# Patient Record
Sex: Female | Born: 1965 | Race: Black or African American | Hispanic: No | Marital: Married | Smoking: Never smoker
Health system: Southern US, Community
[De-identification: ages and names within clinical notes are randomized; demographics above are authoritative.]

## PROBLEM LIST (undated history)

## (undated) DIAGNOSIS — I10 Essential (primary) hypertension: Secondary | ICD-10-CM

## (undated) DIAGNOSIS — E785 Hyperlipidemia, unspecified: Secondary | ICD-10-CM

## (undated) HISTORY — PX: TONSILLECTOMY: SUR1361

---

## 1998-06-15 ENCOUNTER — Ambulatory Visit (HOSPITAL_COMMUNITY): Admission: RE | Admit: 1998-06-15 | Discharge: 1998-06-15 | Payer: Self-pay | Admitting: Obstetrics

## 1998-10-14 ENCOUNTER — Ambulatory Visit (HOSPITAL_COMMUNITY): Admission: RE | Admit: 1998-10-14 | Discharge: 1998-10-14 | Payer: Self-pay | Admitting: *Deleted

## 1999-01-13 ENCOUNTER — Inpatient Hospital Stay (HOSPITAL_COMMUNITY): Admission: AD | Admit: 1999-01-13 | Discharge: 1999-01-15 | Payer: Self-pay | Admitting: Obstetrics

## 1999-01-17 ENCOUNTER — Encounter (HOSPITAL_COMMUNITY): Admission: RE | Admit: 1999-01-17 | Discharge: 1999-04-17 | Payer: Self-pay | Admitting: Obstetrics

## 2003-09-21 ENCOUNTER — Ambulatory Visit (HOSPITAL_COMMUNITY): Admission: RE | Admit: 2003-09-21 | Discharge: 2003-09-21 | Payer: Self-pay | Admitting: Family Medicine

## 2004-01-11 ENCOUNTER — Emergency Department (HOSPITAL_COMMUNITY): Admission: EM | Admit: 2004-01-11 | Discharge: 2004-01-11 | Payer: Self-pay | Admitting: Family Medicine

## 2006-06-25 ENCOUNTER — Encounter: Admission: RE | Admit: 2006-06-25 | Discharge: 2006-06-25 | Payer: Self-pay | Admitting: Obstetrics

## 2008-04-01 ENCOUNTER — Emergency Department (HOSPITAL_COMMUNITY): Admission: EM | Admit: 2008-04-01 | Discharge: 2008-04-01 | Payer: Self-pay | Admitting: Emergency Medicine

## 2009-02-05 ENCOUNTER — Encounter: Admission: RE | Admit: 2009-02-05 | Discharge: 2009-02-05 | Payer: Self-pay | Admitting: Family Medicine

## 2010-05-28 ENCOUNTER — Emergency Department (HOSPITAL_COMMUNITY): Admission: EM | Admit: 2010-05-28 | Discharge: 2010-05-28 | Payer: Self-pay | Admitting: Family Medicine

## 2010-07-13 ENCOUNTER — Encounter: Admission: RE | Admit: 2010-07-13 | Discharge: 2010-07-13 | Payer: Self-pay | Admitting: Obstetrics

## 2011-05-23 ENCOUNTER — Other Ambulatory Visit: Payer: Self-pay | Admitting: Obstetrics

## 2011-05-26 LAB — DIFFERENTIAL
Basophils Absolute: 0
Basophils Relative: 1
Eosinophils Absolute: 0.1
Eosinophils Relative: 2
Lymphocytes Relative: 12
Lymphs Abs: 0.9
Monocytes Absolute: 0.7
Monocytes Relative: 10
Neutro Abs: 5.4
Neutrophils Relative %: 76

## 2011-05-26 LAB — POCT I-STAT, CHEM 8
BUN: 11
BUN: 9
Calcium, Ion: 1.01 — ABNORMAL LOW
Calcium, Ion: 1.16
Chloride: 107
Chloride: 107
Creatinine, Ser: 1.1
Creatinine, Ser: 1.1
Glucose, Bld: 85
Glucose, Bld: 90
HCT: 46
HCT: 46
Hemoglobin: 15.6 — ABNORMAL HIGH
Hemoglobin: 15.6 — ABNORMAL HIGH
Potassium: 3.5
Potassium: 4.2
Sodium: 138
Sodium: 141
TCO2: 23
TCO2: 25

## 2011-05-26 LAB — POCT CARDIAC MARKERS
CKMB, poc: 1.6
CKMB, poc: 2.7
Myoglobin, poc: 203
Myoglobin, poc: 277
Troponin i, poc: <0.05
Troponin i, poc: <0.05

## 2011-05-26 LAB — CBC
HCT: 44.8
Hemoglobin: 15.1 — ABNORMAL HIGH
MCHC: 33.7
MCV: 91.7
Platelets: 246
RBC: 4.89
RDW: 13.2
WBC: 7.1

## 2011-05-26 LAB — PREGNANCY, URINE: Preg Test, Ur: NEGATIVE

## 2011-09-18 ENCOUNTER — Other Ambulatory Visit: Payer: Self-pay | Admitting: Obstetrics

## 2011-09-18 DIAGNOSIS — Z1231 Encounter for screening mammogram for malignant neoplasm of breast: Secondary | ICD-10-CM

## 2011-10-16 ENCOUNTER — Ambulatory Visit
Admission: RE | Admit: 2011-10-16 | Discharge: 2011-10-16 | Disposition: A | Discharge status: Home / Self Care | Payer: 59 | Source: Ambulatory Visit | Attending: Obstetrics | Admitting: Obstetrics

## 2011-10-16 DIAGNOSIS — Z1231 Encounter for screening mammogram for malignant neoplasm of breast: Secondary | ICD-10-CM

## 2013-06-24 ENCOUNTER — Other Ambulatory Visit: Payer: Self-pay

## 2013-06-24 DIAGNOSIS — Z1231 Encounter for screening mammogram for malignant neoplasm of breast: Secondary | ICD-10-CM

## 2013-07-16 ENCOUNTER — Ambulatory Visit
Admission: RE | Admit: 2013-07-16 | Discharge: 2013-07-16 | Disposition: A | Discharge status: Home / Self Care | Payer: BC Managed Care – PPO | Source: Ambulatory Visit

## 2013-07-16 DIAGNOSIS — Z1231 Encounter for screening mammogram for malignant neoplasm of breast: Secondary | ICD-10-CM

## 2014-09-03 ENCOUNTER — Other Ambulatory Visit: Payer: Self-pay

## 2014-09-03 DIAGNOSIS — Z1231 Encounter for screening mammogram for malignant neoplasm of breast: Secondary | ICD-10-CM

## 2014-09-10 ENCOUNTER — Ambulatory Visit
Admission: RE | Admit: 2014-09-10 | Discharge: 2014-09-10 | Disposition: A | Discharge status: Home / Self Care | Payer: BLUE CROSS/BLUE SHIELD | Source: Ambulatory Visit

## 2014-09-10 DIAGNOSIS — Z1231 Encounter for screening mammogram for malignant neoplasm of breast: Secondary | ICD-10-CM

## 2015-10-25 ENCOUNTER — Other Ambulatory Visit: Payer: Self-pay

## 2015-10-25 DIAGNOSIS — Z1231 Encounter for screening mammogram for malignant neoplasm of breast: Secondary | ICD-10-CM

## 2015-11-04 ENCOUNTER — Ambulatory Visit
Admission: RE | Admit: 2015-11-04 | Discharge: 2015-11-04 | Disposition: A | Discharge status: Home / Self Care | Payer: BLUE CROSS/BLUE SHIELD | Source: Ambulatory Visit

## 2015-11-04 DIAGNOSIS — Z1231 Encounter for screening mammogram for malignant neoplasm of breast: Secondary | ICD-10-CM

## 2016-01-13 DIAGNOSIS — J309 Allergic rhinitis, unspecified: Secondary | ICD-10-CM | POA: Diagnosis not present

## 2016-01-13 DIAGNOSIS — E782 Mixed hyperlipidemia: Secondary | ICD-10-CM | POA: Diagnosis not present

## 2016-01-13 DIAGNOSIS — I10 Essential (primary) hypertension: Secondary | ICD-10-CM | POA: Diagnosis not present

## 2016-04-22 ENCOUNTER — Encounter: Payer: Self-pay | Admitting: *Deleted

## 2016-07-11 DIAGNOSIS — Z0000 Encounter for general adult medical examination without abnormal findings: Secondary | ICD-10-CM | POA: Diagnosis not present

## 2016-07-11 DIAGNOSIS — Z23 Encounter for immunization: Secondary | ICD-10-CM | POA: Diagnosis not present

## 2016-07-11 DIAGNOSIS — I10 Essential (primary) hypertension: Secondary | ICD-10-CM | POA: Diagnosis not present

## 2016-07-11 DIAGNOSIS — J309 Allergic rhinitis, unspecified: Secondary | ICD-10-CM | POA: Diagnosis not present

## 2016-07-11 DIAGNOSIS — E782 Mixed hyperlipidemia: Secondary | ICD-10-CM | POA: Diagnosis not present

## 2016-11-27 ENCOUNTER — Other Ambulatory Visit: Payer: Self-pay | Admitting: Family Medicine

## 2016-11-27 DIAGNOSIS — Z1231 Encounter for screening mammogram for malignant neoplasm of breast: Secondary | ICD-10-CM

## 2016-12-13 ENCOUNTER — Ambulatory Visit
Admission: RE | Admit: 2016-12-13 | Discharge: 2016-12-13 | Disposition: A | Discharge status: Home / Self Care | Payer: BLUE CROSS/BLUE SHIELD | Source: Ambulatory Visit | Attending: Family Medicine | Admitting: Family Medicine

## 2016-12-13 DIAGNOSIS — Z1231 Encounter for screening mammogram for malignant neoplasm of breast: Secondary | ICD-10-CM

## 2017-01-10 DIAGNOSIS — Z01419 Encounter for gynecological examination (general) (routine) without abnormal findings: Secondary | ICD-10-CM | POA: Diagnosis not present

## 2017-01-16 DIAGNOSIS — I10 Essential (primary) hypertension: Secondary | ICD-10-CM | POA: Diagnosis not present

## 2017-01-16 DIAGNOSIS — J309 Allergic rhinitis, unspecified: Secondary | ICD-10-CM | POA: Diagnosis not present

## 2017-01-16 DIAGNOSIS — E782 Mixed hyperlipidemia: Secondary | ICD-10-CM | POA: Diagnosis not present

## 2017-01-16 DIAGNOSIS — M722 Plantar fascial fibromatosis: Secondary | ICD-10-CM | POA: Diagnosis not present

## 2017-02-26 DIAGNOSIS — M545 Low back pain: Secondary | ICD-10-CM | POA: Diagnosis not present

## 2017-02-26 DIAGNOSIS — M722 Plantar fascial fibromatosis: Secondary | ICD-10-CM | POA: Diagnosis not present

## 2017-05-02 DIAGNOSIS — H524 Presbyopia: Secondary | ICD-10-CM | POA: Diagnosis not present

## 2017-05-02 DIAGNOSIS — H40051 Ocular hypertension, right eye: Secondary | ICD-10-CM | POA: Diagnosis not present

## 2017-05-02 DIAGNOSIS — H5213 Myopia, bilateral: Secondary | ICD-10-CM | POA: Diagnosis not present

## 2017-07-24 DIAGNOSIS — E782 Mixed hyperlipidemia: Secondary | ICD-10-CM | POA: Diagnosis not present

## 2017-08-08 ENCOUNTER — Encounter: Payer: Self-pay | Admitting: Podiatry

## 2017-08-08 ENCOUNTER — Ambulatory Visit (INDEPENDENT_AMBULATORY_CARE_PROVIDER_SITE_OTHER): Payer: BLUE CROSS/BLUE SHIELD

## 2017-08-08 ENCOUNTER — Ambulatory Visit: Payer: BLUE CROSS/BLUE SHIELD | Admitting: Podiatry

## 2017-08-08 VITALS — Ht 63.0 in | Wt 248.0 lb

## 2017-08-08 DIAGNOSIS — M722 Plantar fascial fibromatosis: Secondary | ICD-10-CM

## 2017-08-08 MED ORDER — MELOXICAM 15 MG PO TABS: 15.0000 mg | ORAL_TABLET | Freq: Every day | ORAL | 0 refills | Status: AC

## 2017-08-08 NOTE — Progress Notes (Signed)
   Subjective:    Patient ID: Rachel Grieveamela M Mcconnell, female    DOB: 09/04/1965, 51 y.o.   MRN: [id number]  HPI  Chief Complaint  Patient presents with  . Foot Pain    bilateral foot pain - Dx'd 2 years ago with plantar fasciitis - acute pain since June - OTC inserts and compression socks have not helped       Review of Systems  All other systems reviewed and are negative.      Objective:   Physical Exam        Assessment & Plan:

## 2017-08-08 NOTE — Patient Instructions (Signed)

## 2017-08-12 NOTE — Progress Notes (Signed)
   Subjective: 51 year old female presents today as a new patient for pain and tenderness in the heels bilaterally that has been ongoing for the past 2 years.  She states the pain has worsened in the last 6 months.  Patient states that it hurts in the morning with the first steps out of bed.  She states she has tried wearing compression socks and OTC inserts but nothing has worked to alleviate the pain.  Patient presents today for further treatment and evaluation.   History reviewed. No pertinent past medical history.   Objective: Physical Exam General: The patient is alert and oriented x3 in no acute distress.  Dermatology: Skin is warm, dry and supple bilateral lower extremities. Negative for open lesions or macerations bilateral.   Vascular: Dorsalis Pedis and Posterior Tibial pulses palpable bilateral.  Capillary fill time is immediate to all digits.  Neurological: Epicritic and protective threshold intact bilateral.   Musculoskeletal: Tenderness to palpation at the medial calcaneal tubercale and through the insertion of the plantar fascia of the bilateral feet. All other joints range of motion within normal limits bilateral. Strength 5/5 in all groups bilateral.   Radiographic exam: Normal osseous mineralization. Joint spaces preserved. No fracture/dislocation/boney destruction. Calcaneal spur present with mild thickening of plantar fascia bilateral. No other soft tissue abnormalities or radiopaque foreign bodies.   Assessment: 1. plantar fasciitis bilateral feet  Plan of Care:  1. Patient evaluated. Xrays reviewed.   2. Injection of 0.5cc Celestone soluspan injected into the bilateral heels.  3. Prescription for meloxicam provided to patient.   4. Patient molded for custom orthotics today.  Dawn will check insurance coverage. 5. Plantar fascial band(s) dispensed for bilateral plantar fasciitis. 6. Instructed patient regarding therapies and modalities at home to alleviate  symptoms.  7. Return to clinic in 4 weeks.    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  Felecia ShellingBrent M. Inna Tisdell, DPM [facility]  Dr. Felecia ShellingBrent M. Thayne Cindric, DPM    [address]                                   [place], [place] [address]                Office [phone]  Fax [phone]

## 2017-09-11 ENCOUNTER — Telehealth: Payer: Self-pay | Admitting: *Deleted

## 2017-09-11 MED ORDER — MELOXICAM 15 MG PO TABS: 15.0000 mg | ORAL_TABLET | Freq: Every day | ORAL | 0 refills | Status: AC

## 2017-09-11 NOTE — Telephone Encounter (Signed)
Refill request meloxicam. Dr. Logan BoresEvans states refill once and pt needs to be seen if continuing to have pain.

## 2017-09-19 ENCOUNTER — Other Ambulatory Visit: Payer: BLUE CROSS/BLUE SHIELD | Admitting: Orthotics

## 2017-09-19 ENCOUNTER — Ambulatory Visit: Payer: BLUE CROSS/BLUE SHIELD | Admitting: Podiatry

## 2017-09-25 DIAGNOSIS — M79676 Pain in unspecified toe(s): Secondary | ICD-10-CM

## 2017-10-03 ENCOUNTER — Ambulatory Visit: Payer: BLUE CROSS/BLUE SHIELD | Admitting: Podiatry

## 2017-10-03 ENCOUNTER — Encounter: Payer: Self-pay | Admitting: Podiatry

## 2017-10-03 DIAGNOSIS — M722 Plantar fascial fibromatosis: Secondary | ICD-10-CM | POA: Diagnosis not present

## 2017-10-07 NOTE — Progress Notes (Signed)
   Subjective: 52 year old female presenting today for follow up evaluation of bilateral plantar fasciitis. She states she has improved significantly. She states she did not like the injections at first, but now she believes they helped resolve the pain. She has been taking Mobic, which helped alleviate the symptoms, but has now discontinued it. Wearing the fascial braces and inserts have also contributed to her relief. Patient is here for further evaluation and treatment.   History reviewed. No pertinent past medical history.    Objective: Physical Exam General: The patient is alert and oriented x3 in no acute distress.  Dermatology: Skin is cool, dry and supple bilateral lower extremities. Negative for open lesions or macerations.  Vascular: Palpable pedal pulses bilaterally. No edema or erythema noted. Capillary refill within normal limits.  Neurological: Epicritic and protective threshold grossly intact bilaterally.   Musculoskeletal Exam: Clinical evidence of bunion deformity noted to the respective foot. There is a moderate pain on palpation range of motion of the first MPJ. Lateral deviation of the hallux noted consistent with hallux abductovalgus.   Assessment: 1. HAV w/ bunion deformity bilateral feet 2. Plantar fasciitis bilateral - resolved   Plan of Care:  1. Patient was evaluated.  2. Today we discussed possible bunion surgery in the future.  3. Continue taking Mobic and wearing custom molded orthotics. 4. Continue stretching.  5. Return to clinic as needed.    Felecia ShellingBrent M. Evans, DPM [facility]  Dr. Felecia ShellingBrent M. Evans, DPM    [address]                                        [place], [place] [address]                Office [phone]  Fax [phone]

## 2017-10-15 ENCOUNTER — Telehealth: Payer: Self-pay | Admitting: *Deleted

## 2017-10-15 NOTE — Telephone Encounter (Signed)
Request for Meloxicam refill. Dr. Logan BoresEvans states pt needs an appt. Return fax denying.

## 2017-10-17 ENCOUNTER — Other Ambulatory Visit: Payer: BLUE CROSS/BLUE SHIELD | Admitting: Orthotics

## 2019-05-14 ENCOUNTER — Other Ambulatory Visit: Payer: Self-pay | Admitting: Family Medicine

## 2019-05-14 DIAGNOSIS — Z1231 Encounter for screening mammogram for malignant neoplasm of breast: Secondary | ICD-10-CM

## 2019-06-27 ENCOUNTER — Ambulatory Visit
Admission: RE | Admit: 2019-06-27 | Discharge: 2019-06-27 | Disposition: A | Discharge status: Home / Self Care | Payer: 59 | Source: Ambulatory Visit | Attending: Family Medicine | Admitting: Family Medicine

## 2019-06-27 ENCOUNTER — Other Ambulatory Visit: Payer: Self-pay

## 2019-06-27 DIAGNOSIS — Z1231 Encounter for screening mammogram for malignant neoplasm of breast: Secondary | ICD-10-CM

## 2019-10-16 DIAGNOSIS — Z0000 Encounter for general adult medical examination without abnormal findings: Secondary | ICD-10-CM | POA: Diagnosis not present

## 2019-10-23 DIAGNOSIS — E782 Mixed hyperlipidemia: Secondary | ICD-10-CM | POA: Diagnosis not present

## 2019-10-23 DIAGNOSIS — Z23 Encounter for immunization: Secondary | ICD-10-CM | POA: Diagnosis not present

## 2019-12-03 DIAGNOSIS — Z1211 Encounter for screening for malignant neoplasm of colon: Secondary | ICD-10-CM | POA: Diagnosis not present

## 2019-12-17 DIAGNOSIS — Z1159 Encounter for screening for other viral diseases: Secondary | ICD-10-CM | POA: Diagnosis not present

## 2019-12-22 DIAGNOSIS — Z1211 Encounter for screening for malignant neoplasm of colon: Secondary | ICD-10-CM | POA: Diagnosis not present

## 2020-04-20 DIAGNOSIS — E782 Mixed hyperlipidemia: Secondary | ICD-10-CM | POA: Diagnosis not present

## 2020-04-20 DIAGNOSIS — I10 Essential (primary) hypertension: Secondary | ICD-10-CM | POA: Diagnosis not present

## 2020-04-20 DIAGNOSIS — J309 Allergic rhinitis, unspecified: Secondary | ICD-10-CM | POA: Diagnosis not present

## 2020-05-11 DIAGNOSIS — Z01419 Encounter for gynecological examination (general) (routine) without abnormal findings: Secondary | ICD-10-CM | POA: Diagnosis not present

## 2020-11-04 DIAGNOSIS — J309 Allergic rhinitis, unspecified: Secondary | ICD-10-CM | POA: Diagnosis not present

## 2020-11-04 DIAGNOSIS — Z0000 Encounter for general adult medical examination without abnormal findings: Secondary | ICD-10-CM | POA: Diagnosis not present

## 2020-11-04 DIAGNOSIS — I10 Essential (primary) hypertension: Secondary | ICD-10-CM | POA: Diagnosis not present

## 2020-11-04 DIAGNOSIS — E782 Mixed hyperlipidemia: Secondary | ICD-10-CM | POA: Diagnosis not present

## 2021-03-21 DIAGNOSIS — H5213 Myopia, bilateral: Secondary | ICD-10-CM | POA: Diagnosis not present

## 2021-03-21 DIAGNOSIS — H524 Presbyopia: Secondary | ICD-10-CM | POA: Diagnosis not present

## 2021-05-12 DIAGNOSIS — E782 Mixed hyperlipidemia: Secondary | ICD-10-CM | POA: Diagnosis not present

## 2021-05-12 DIAGNOSIS — I10 Essential (primary) hypertension: Secondary | ICD-10-CM | POA: Diagnosis not present

## 2021-05-12 DIAGNOSIS — J309 Allergic rhinitis, unspecified: Secondary | ICD-10-CM | POA: Diagnosis not present

## 2021-05-12 DIAGNOSIS — Z23 Encounter for immunization: Secondary | ICD-10-CM | POA: Diagnosis not present

## 2021-05-13 DIAGNOSIS — Z01419 Encounter for gynecological examination (general) (routine) without abnormal findings: Secondary | ICD-10-CM | POA: Diagnosis not present

## 2021-05-20 IMAGING — MG DIGITAL SCREENING BILAT W/ TOMO
6 of 10 series · 6 of 30 positions shown · non-contrast
Comparison: Previous exam(s).

CLINICAL DATA: Screening.

EXAM:
DIGITAL SCREENING BILATERAL MAMMOGRAM WITH TOMO AND CAD

[L MLO synth-2D (1 of 2)]
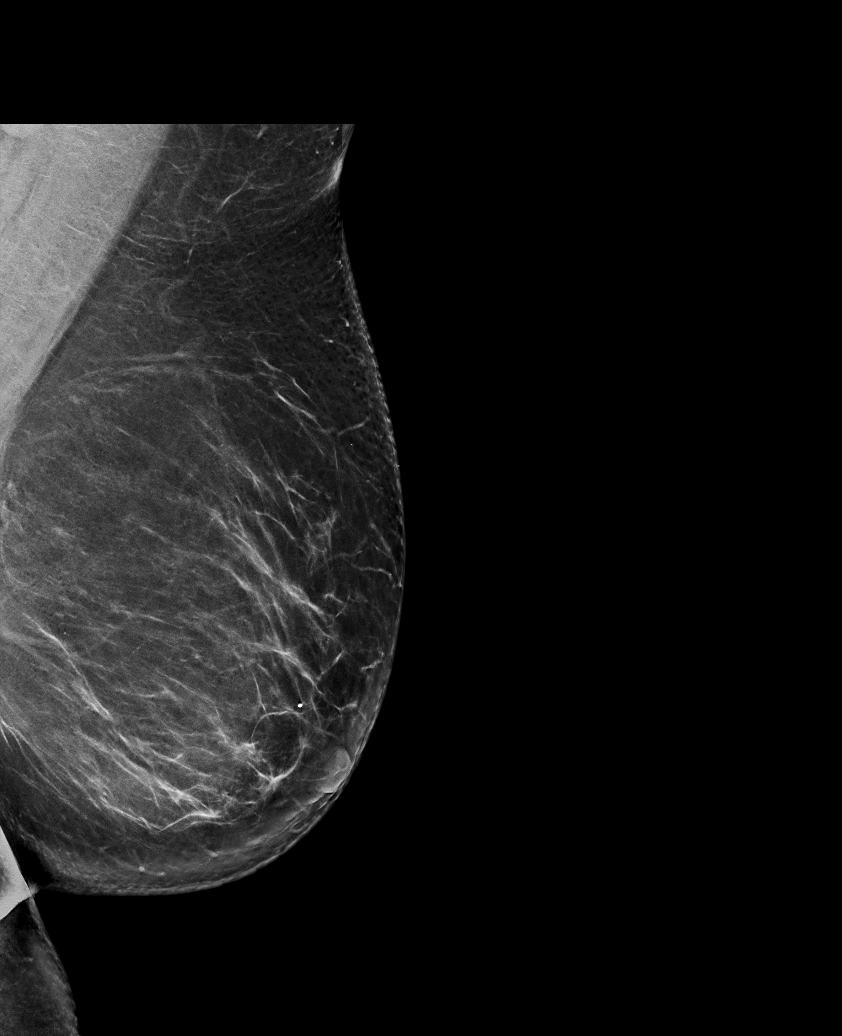

[R MLO synth-2D]
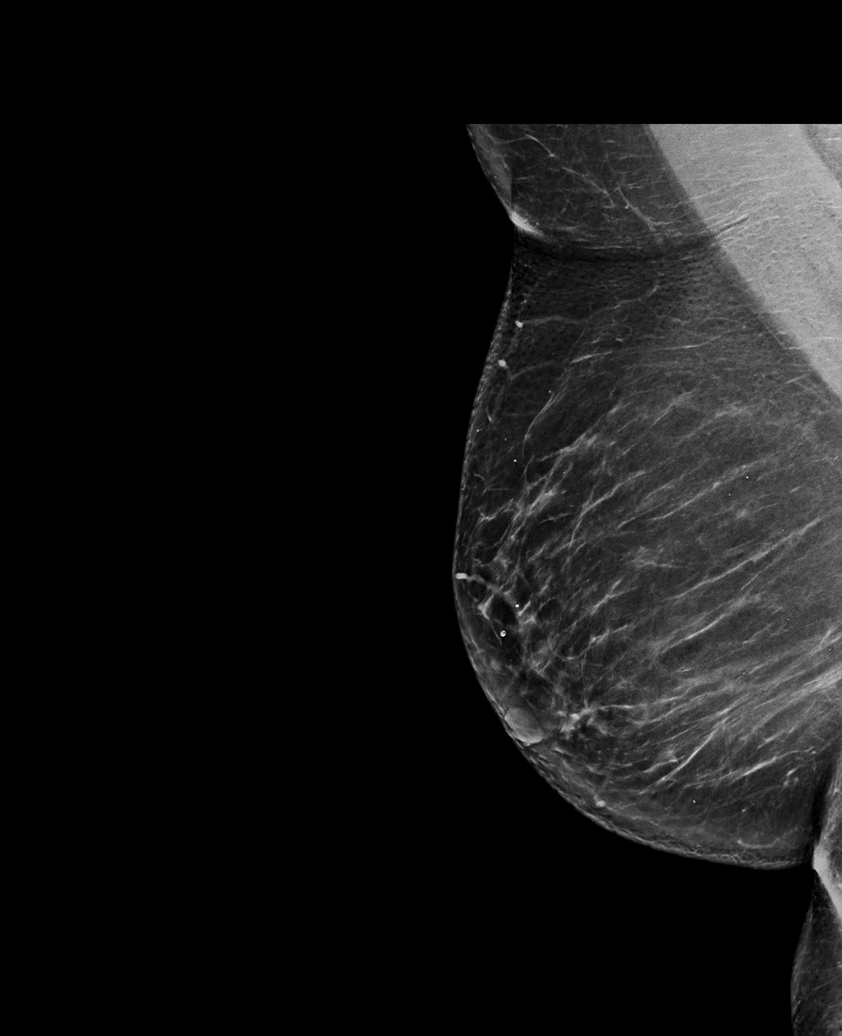

[L MLO synth-2D (2 of 2)]
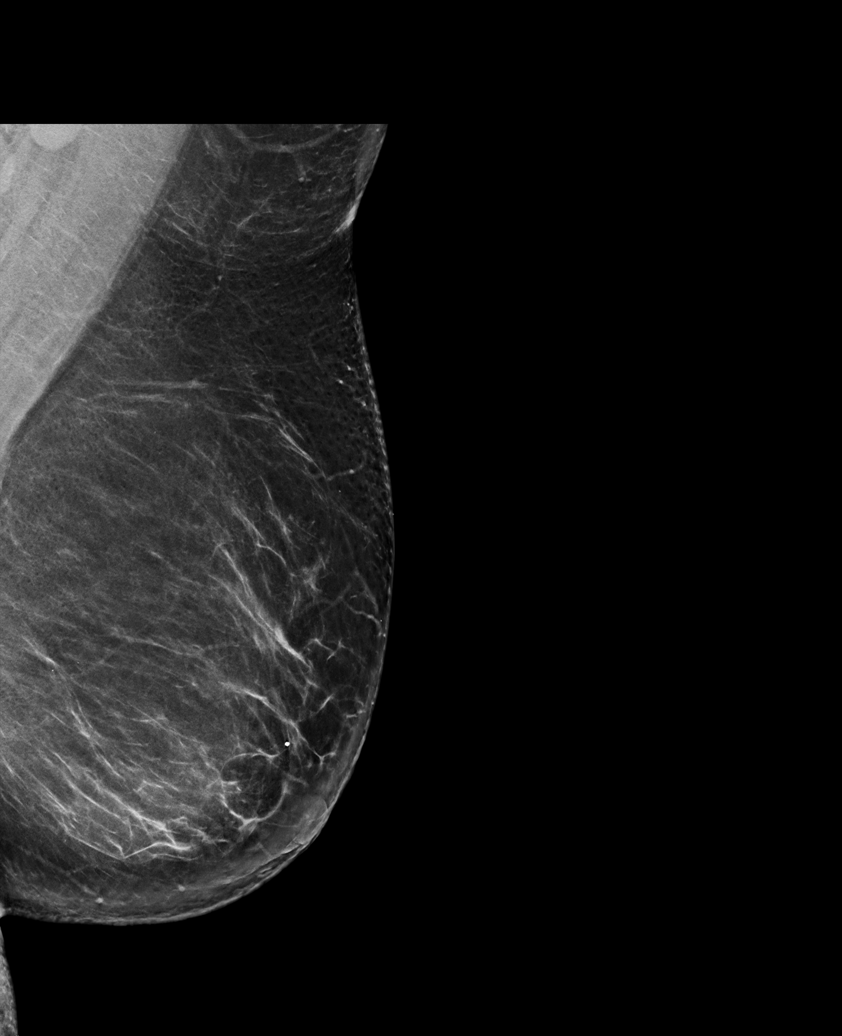

[L CC synth-2D]
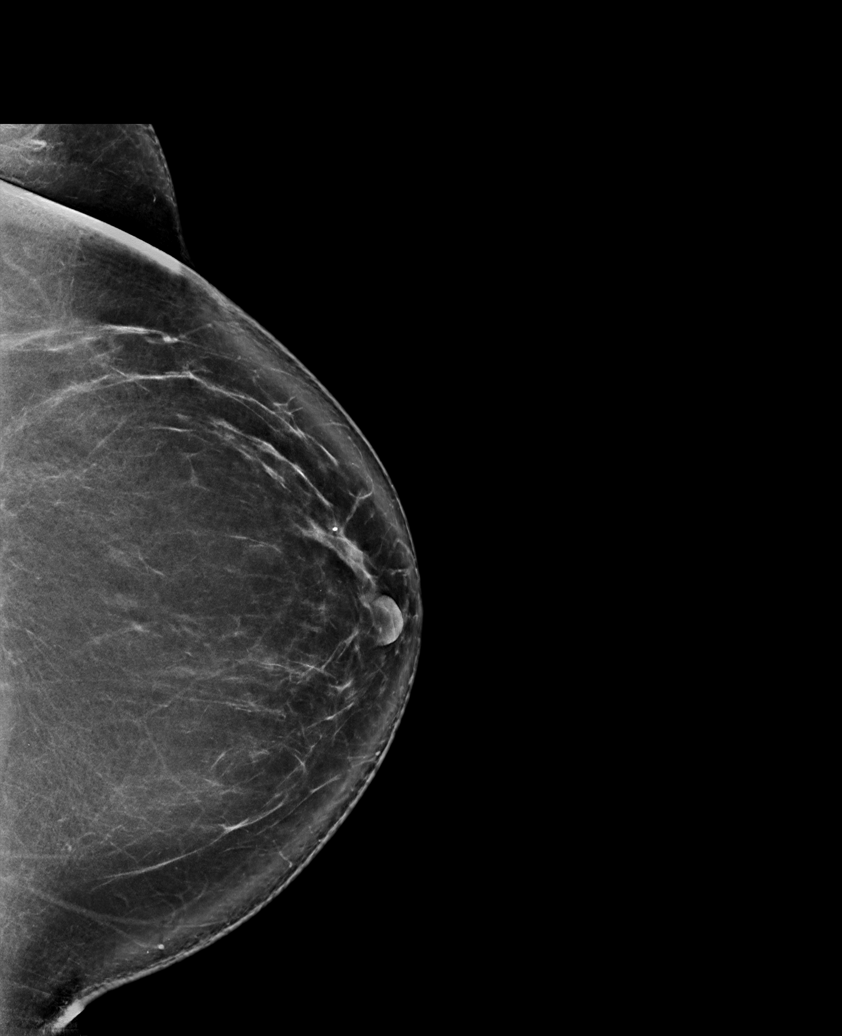

[R CC synth-2D]
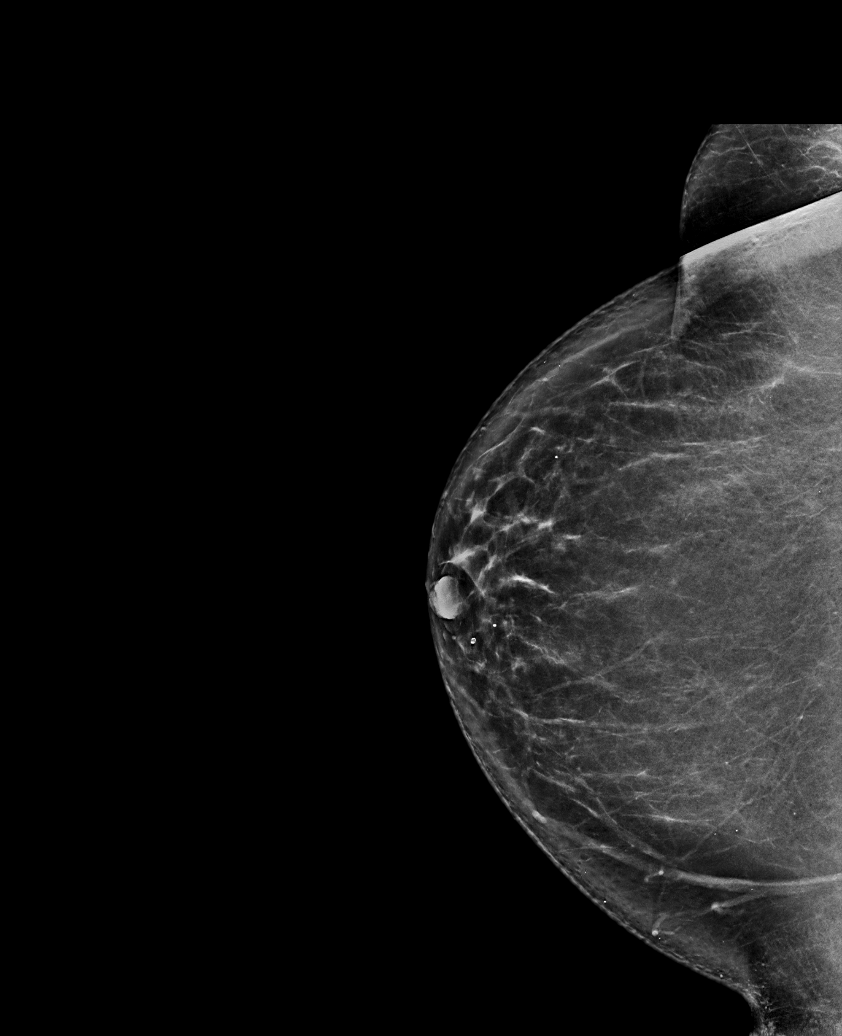

[L CC tomo · tomo slice 51/101.0]
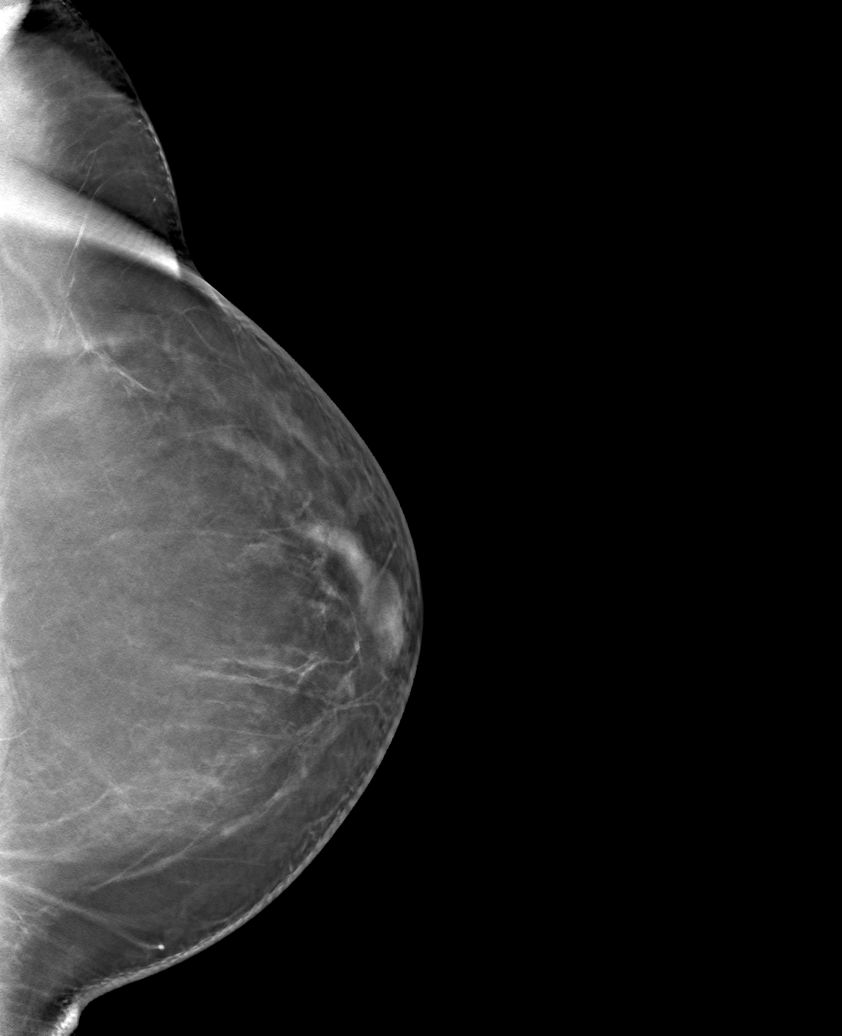

[6 of 30 positions shown; findings below may reference images not displayed]

ACR Breast Density Category b: There are scattered areas of
fibroglandular density.
FINDINGS: There are no findings suspicious for malignancy. Images were
processed with CAD.
IMPRESSION: No mammographic evidence of malignancy. A result letter of this
screening mammogram will be mailed directly to the patient.

RECOMMENDATION:
Screening mammogram in one year. (Code:CN-U-775)

BI-RADS CATEGORY  1: Negative.

## 2021-05-28 ENCOUNTER — Ambulatory Visit
Admission: EM | Admit: 2021-05-28 | Discharge: 2021-05-28 | Disposition: A | Discharge status: Home / Self Care | Payer: BC Managed Care – PPO | Attending: Urgent Care | Admitting: Urgent Care

## 2021-05-28 DIAGNOSIS — J988 Other specified respiratory disorders: Secondary | ICD-10-CM | POA: Diagnosis not present

## 2021-05-28 DIAGNOSIS — R0981 Nasal congestion: Secondary | ICD-10-CM

## 2021-05-28 DIAGNOSIS — R070 Pain in throat: Secondary | ICD-10-CM

## 2021-05-28 DIAGNOSIS — B9789 Other viral agents as the cause of diseases classified elsewhere: Secondary | ICD-10-CM | POA: Diagnosis not present

## 2021-05-28 DIAGNOSIS — Z20822 Contact with and (suspected) exposure to covid-19: Secondary | ICD-10-CM

## 2021-05-28 DIAGNOSIS — I10 Essential (primary) hypertension: Secondary | ICD-10-CM

## 2021-05-28 HISTORY — DX: Essential (primary) hypertension: I10

## 2021-05-28 HISTORY — DX: Hyperlipidemia, unspecified: E78.5

## 2021-05-28 MED ORDER — CETIRIZINE HCL 10 MG PO TABS: 10.0000 mg | ORAL_TABLET | Freq: Every day | ORAL | 0 refills | Status: DC

## 2021-05-28 MED ORDER — PSEUDOEPHEDRINE HCL 30 MG PO TABS: 30.0000 mg | ORAL_TABLET | Freq: Three times a day (TID) | ORAL | 0 refills | Status: AC | PRN

## 2021-05-28 MED ORDER — PROMETHAZINE-DM 6.25-15 MG/5ML PO SYRP: 5.0000 mL | ORAL_SOLUTION | Freq: Every evening | ORAL | 0 refills | Status: AC | PRN

## 2021-05-28 MED ORDER — BENZONATATE 100 MG PO CAPS: 100.0000 mg | ORAL_CAPSULE | Freq: Three times a day (TID) | ORAL | 0 refills | Status: AC | PRN

## 2021-05-28 NOTE — ED Provider Notes (Signed)
[facility]   MRN: [id number] DOB: [birth year]  Subjective:   Rachel Mcconnell is a 55 y.o. female presenting for 3-day history of acute onset sinus congestion, mild throat pain, productive cough. Had exposure to COVID 19 recently. Has a history of HTN, takes her medications consistently.  Denies fever, body aches, chest pain, shortness of breath, wheezing.  No history of respiratory disorders.  No current facility-administered medications for this encounter.  Current Outpatient Medications:    JUNEL FE 1/20 1-20 MG-MCG tablet, Take 1 tablet by mouth daily., Disp: , Rfl: 3   losartan (COZAAR) 100 MG tablet, Take 100 mg by mouth daily., Disp: , Rfl: 1   meloxicam (MOBIC) 15 MG tablet, Take 1 tablet (15 mg total) by mouth daily., Disp: 30 tablet, Rfl: 0   meloxicam (MOBIC) 15 MG tablet, Take 1 tablet (15 mg total) by mouth daily., Disp: 30 tablet, Rfl: 0   simvastatin (ZOCOR) 20 MG tablet, 1 TABLET EVERY EVENING ONCE A DAY ORALLY 90 DAYS, Disp: , Rfl: 1   triamterene-hydrochlorothiazide (DYAZIDE) 37.5-25 MG capsule, , Disp: , Rfl:    No Known Allergies  Past Medical History:  Diagnosis Date   Hyperlipemia    Hypertension      Past Surgical History:  Procedure Laterality Date   TONSILLECTOMY      History reviewed. No pertinent family history.   ROS   Objective:   Vitals: BP (!) 151/97 (BP Location: Left Arm) Comment: Pt did not take her BP this morning  Pulse 81   Temp 98.9 F (37.2 C) (Oral)   Resp 18   SpO2 96%   Physical Exam Constitutional:      General: She is not in acute distress.    Appearance: Normal appearance. She is well-developed. She is obese. She is not ill-appearing, toxic-appearing or diaphoretic.  HENT:     Head: Normocephalic and atraumatic.     Right Ear: Tympanic membrane and ear canal normal. No drainage or tenderness. No middle ear effusion. Tympanic membrane is not erythematous.     Left Ear: Tympanic membrane and ear canal  normal. No drainage or tenderness.  No middle ear effusion. Tympanic membrane is not erythematous.     Nose: Nose normal. No congestion or rhinorrhea.     Mouth/Throat:     Mouth: Mucous membranes are moist. No oral lesions.     Pharynx: Oropharynx is clear. No pharyngeal swelling, oropharyngeal exudate, posterior oropharyngeal erythema or uvula swelling.     Tonsils: No tonsillar exudate or tonsillar abscesses.  Eyes:     Extraocular Movements: Extraocular movements intact.     Right eye: Normal extraocular motion.     Left eye: Normal extraocular motion.     Conjunctiva/sclera: Conjunctivae normal.     Pupils: Pupils are equal, round, and reactive to light.  Cardiovascular:     Rate and Rhythm: Normal rate and regular rhythm.     Pulses: Normal pulses.     Heart sounds: Normal heart sounds. No murmur heard.   No friction rub. No gallop.  Pulmonary:     Effort: Pulmonary effort is normal. No respiratory distress.     Breath sounds: Normal breath sounds. No stridor. No wheezing, rhonchi or rales.  Musculoskeletal:     Cervical back: Normal range of motion and neck supple.  Lymphadenopathy:     Cervical: No cervical adenopathy.  Skin:    General: Skin is warm and dry.     Findings: No rash.  Neurological:  General: No focal deficit present.     Mental Status: She is alert and oriented to person, place, and time.  Psychiatric:        Mood and Affect: Mood normal.        Behavior: Behavior normal.        Thought Content: Thought content normal.    Assessment and Plan :   PDMP not reviewed this encounter.  1. Viral respiratory illness   2. Throat pain   3. Sinus congestion   4. Essential hypertension   5. Exposure to COVID-19 virus     Will manage for viral illness such as viral URI, viral syndrome, viral rhinitis, COVID-19. Counseled patient on nature of COVID-19 including modes of transmission, diagnostic testing, management and supportive care.  Offered scripts for  symptomatic relief. COVID 19 testing is pending. Counseled patient on potential for adverse effects with medications prescribed/recommended today, ER and return-to-clinic precautions discussed, patient verbalized understanding.     Wallis Bamberg, [place] 05/28/21 [phone]

## 2021-05-28 NOTE — Discharge Instructions (Addendum)
We will notify you of your COVID-19 test results as they arrive and may take between 48-72 hours.  I encourage you to sign up for MyChart if you have not already done so as this can be the easiest way for us to communicate results to you online or through a phone app.  Generally, we only contact you if it is a positive COVID result.  In the meantime, if you develop worsening symptoms including fever, chest pain, shortness of breath despite our current treatment plan then please report to the emergency room as this may be a sign of worsening status from possible COVID-19 infection.  Otherwise, we will manage this as a viral syndrome. For sore throat or cough try using a honey-based tea. Use 3 teaspoons of honey with juice squeezed from half lemon. Place shaved pieces of ginger into 1/2-1 cup of water and warm over stove top. Then mix the ingredients and repeat every 4 hours as needed. Please take Tylenol 500mg-650mg every 6 hours for aches and pains, fevers. Hydrate very well with at least 2 liters of water. Eat light meals such as soups to replenish electrolytes and soft fruits, veggies. Start an antihistamine like Zyrtec, Allegra or Claritin for postnasal drainage, sinus congestion.  You can take this together with pseudoephedrine (Sudafed) at a dose of 30 mg 2-3 times a day as needed for the same kind of congestion.    

## 2021-05-28 NOTE — ED Triage Notes (Signed)
3 day h/o post nasal drip, sore throat and congestion.  Congestion is worse at night.  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 No cough or HA.  Has been taking sudafed sinus and advil with some relief. Pt is covid vaccinated and partially boosted.

## 2021-05-29 LAB — SARS-COV-2, NAA 2 DAY TAT

## 2021-05-29 LAB — NOVEL CORONAVIRUS, NAA: SARS-CoV-2, NAA: DETECTED — AB

## 2021-11-04 DIAGNOSIS — I10 Essential (primary) hypertension: Secondary | ICD-10-CM | POA: Diagnosis not present

## 2021-11-04 DIAGNOSIS — E782 Mixed hyperlipidemia: Secondary | ICD-10-CM | POA: Diagnosis not present

## 2021-11-04 DIAGNOSIS — J309 Allergic rhinitis, unspecified: Secondary | ICD-10-CM | POA: Diagnosis not present

## 2021-11-04 DIAGNOSIS — Z23 Encounter for immunization: Secondary | ICD-10-CM | POA: Diagnosis not present

## 2021-12-20 DIAGNOSIS — A609 Anogenital herpesviral infection, unspecified: Secondary | ICD-10-CM | POA: Diagnosis not present

## 2021-12-20 DIAGNOSIS — Z3009 Encounter for other general counseling and advice on contraception: Secondary | ICD-10-CM | POA: Diagnosis not present

## 2021-12-20 DIAGNOSIS — Z30011 Encounter for initial prescription of contraceptive pills: Secondary | ICD-10-CM | POA: Diagnosis not present

## 2022-05-15 DIAGNOSIS — E782 Mixed hyperlipidemia: Secondary | ICD-10-CM | POA: Diagnosis not present

## 2022-05-15 DIAGNOSIS — I10 Essential (primary) hypertension: Secondary | ICD-10-CM | POA: Diagnosis not present

## 2022-05-15 DIAGNOSIS — J309 Allergic rhinitis, unspecified: Secondary | ICD-10-CM | POA: Diagnosis not present

## 2022-05-15 DIAGNOSIS — Z01419 Encounter for gynecological examination (general) (routine) without abnormal findings: Secondary | ICD-10-CM | POA: Diagnosis not present

## 2022-06-25 ENCOUNTER — Other Ambulatory Visit: Payer: Self-pay

## 2022-06-25 ENCOUNTER — Ambulatory Visit
Admission: EM | Admit: 2022-06-25 | Discharge: 2022-06-25 | Disposition: A | Discharge status: Home / Self Care | Payer: BC Managed Care – PPO | Attending: Emergency Medicine | Admitting: Emergency Medicine

## 2022-06-25 ENCOUNTER — Encounter: Payer: Self-pay | Admitting: Emergency Medicine

## 2022-06-25 DIAGNOSIS — R0982 Postnasal drip: Secondary | ICD-10-CM | POA: Diagnosis not present

## 2022-06-25 DIAGNOSIS — J309 Allergic rhinitis, unspecified: Secondary | ICD-10-CM | POA: Diagnosis not present

## 2022-06-25 MED ORDER — METHYLPREDNISOLONE 8 MG PO TABS: 8.0000 mg | ORAL_TABLET | Freq: Every day | ORAL | 0 refills | Status: AC

## 2022-06-25 MED ORDER — IPRATROPIUM BROMIDE 0.06 % NA SOLN: 2.0000 | Freq: Three times a day (TID) | NASAL | 1 refills | Status: AC

## 2022-06-25 MED ORDER — FLUTICASONE PROPIONATE 50 MCG/ACT NA SUSP: 1.0000 | Freq: Every day | NASAL | 2 refills | Status: AC

## 2022-06-25 MED ORDER — CETIRIZINE HCL 10 MG PO TABS: 10.0000 mg | ORAL_TABLET | Freq: Every day | ORAL | 1 refills | Status: AC

## 2022-06-25 NOTE — Discharge Instructions (Signed)
Please see the list below for recommended medications, dosages and frequencies to provide relief of current symptoms:     Medrol Dosepak (methylprednisolone): This is a steroid that will significantly calm your upper and lower airways, please take the daily recommended quantity of tablets daily with your breakfast meal starting tomorrow morning until the prescription is complete.      Zyrtec (cetirizine): This is an excellent second-generation antihistamine that helps to reduce respiratory inflammatory response to environmental allergens.  In some patients, this medication can cause daytime sleepiness so I recommend that you take 1 tablet daily at bedtime.     Flonase (fluticasone): This is a steroid nasal spray that you use once daily, 1 spray in each nare.  This medication does not work well if you decide to use it only used as you feel you need to, it works best used on a daily basis.  After 3 to 5 days of use, you will notice significant reduction of the inflammation and mucus production that is currently being caused by exposure to allergens, whether seasonal or environmental.  The most common side effect of this medication is nosebleeds.  If you experience a nosebleed, please discontinue use for 1 week, then feel free to resume.  I have provided you with a prescription.     Atrovent (ipratropium): This is an excellent nasal decongestant spray I have added to your recommended nasal steroid that will not cause rebound congestion, please instill 2 sprays into each nare with each use.  Because nasal steroids can take several days before they begin to provide full benefit, I recommend that you use this spray in addition to the nasal steroid prescribed for you.  Please use it after you have used your nasal steroid and repeat up to 4 times daily as needed.  I have provided you with a prescription for this medication.      If you find that your health insurance will not pay for allergy medications, please  consider downloading the GoodRx app and using to get a better price than the "off the shelf" price.     If you find that you have not had improvement of your symptoms in the next 5 to 7 days, please follow-up with your primary care provider or return here to urgent care for repeat evaluation and further recommendations.   Thank you for visiting urgent care today.  We appreciate the opportunity to participate in your care.

## 2022-06-25 NOTE — ED Triage Notes (Signed)
Pt sts some laryngitis sx x several days and right ear pain starting last night

## 2022-06-25 NOTE — ED Provider Notes (Signed)
[facility]    CSN: [id number] Arrival date & time: 06/25/22  [id number]    HISTORY   Chief Complaint  Patient presents with   Otalgia   HPI Rachel Mcconnell is a pleasant, 56 y.o. female who presents to urgent care today. Pt c/o hoarseness w/o throat pain x several days and significant right ear pain that began last night.  Reports history of allergies, not currently taking allergy medications.  The history is provided by the patient.  Otalgia Location:  Right Behind ear:  No abnormality Quality:  Pressure, sharp and shooting Severity:  Moderate Onset quality:  Sudden Duration:  1 day Timing:  Constant Progression:  Waxing and waning Chronicity:  New Context: recent URI   Context: not elevation change, not foreign body in ear, not loud noise and not water in ear   Relieved by: warm compress. Ineffective treatments:  None tried Associated symptoms: congestion, cough, rhinorrhea and sore throat   Associated symptoms: no abdominal pain, no diarrhea, no ear discharge, no fever, no headaches, no hearing loss, no neck pain, no rash, no tinnitus and no vomiting   Congestion:    Location:  Nasal   Interferes with sleep: no     Interferes with eating/drinking: no   Cough:    Cough characteristics:  Non-productive   Severity:  Mild   Onset quality:  Unable to specify   Timing:  Unable to specify   Progression:  Waxing and waning Rhinorrhea:    Quality:  Clear   Severity:  Mild Risk factors: no recent travel, no chronic ear infection and no prior ear surgery    Past Medical History:  Diagnosis Date   Hyperlipemia    Hypertension    There are no problems to display for this patient.  Past Surgical History:  Procedure Laterality Date   TONSILLECTOMY     OB History   No obstetric history on file.    Home Medications    Prior to Admission medications   Medication Sig Start Date End Date Taking? Authorizing Provider  benzonatate (TESSALON) 100 MG capsule Take  1-2 capsules (100-200 mg total) by mouth 3 (three) times daily as needed for cough. 05/28/21   Jaynee Eagles, PA-C  cetirizine (ZYRTEC ALLERGY) 10 MG tablet Take 1 tablet (10 mg total) by mouth daily. 05/28/21   Jaynee Eagles, PA-C  JUNEL FE 1/20 1-20 MG-MCG tablet Take 1 tablet by mouth daily. 08/04/17   [provider]  losartan (COZAAR) 100 MG tablet Take 100 mg by mouth daily. 07/08/17   [provider]  meloxicam (MOBIC) 15 MG tablet Take 1 tablet (15 mg total) by mouth daily. 08/08/17   Edrick Kins, DPM  meloxicam (MOBIC) 15 MG tablet Take 1 tablet (15 mg total) by mouth daily. 09/11/17   Edrick Kins, DPM  promethazine-dextromethorphan (PROMETHAZINE-DM) 6.25-15 MG/5ML syrup Take 5 mLs by mouth at bedtime as needed for cough. 05/28/21   Jaynee Eagles, PA-C  pseudoephedrine (SUDAFED) 30 MG tablet Take 1 tablet (30 mg total) by mouth every 8 (eight) hours as needed for congestion. 05/28/21   Jaynee Eagles, PA-C  simvastatin (ZOCOR) 20 MG tablet 1 TABLET EVERY EVENING ONCE A DAY ORALLY 90 DAYS 07/08/17   [provider]  triamterene-hydrochlorothiazide (DYAZIDE) 37.5-25 MG capsule  08/07/17   [provider]    Family History History reviewed. No pertinent family history. Social History Social History   Tobacco Use   Smoking status: Never   Smokeless tobacco: Never  Allergies   Patient has no known allergies.  Review of Systems Review of Systems  Constitutional:  Negative for fever.  HENT:  Positive for congestion, ear pain, rhinorrhea and sore throat. Negative for ear discharge, hearing loss and tinnitus.   Respiratory:  Positive for cough.   Gastrointestinal:  Negative for abdominal pain, diarrhea and vomiting.  Musculoskeletal:  Negative for neck pain.  Skin:  Negative for rash.  Neurological:  Negative for headaches.   Pertinent findings revealed after performing a 14 point review of systems has been noted in the history of present  illness.  Physical Exam Triage Vital Signs ED Triage Vitals  Enc Vitals Group     BP 06/24/21 0827 (!) 147/82     Pulse Rate 06/24/21 0827 72     Resp 06/24/21 0827 18     Temp 06/24/21 0827 98.3 F (36.8 C)     Temp Source 06/24/21 0827 Oral     SpO2 06/24/21 0827 98 %     Weight --      Height --      Head Circumference --      Peak Flow --      Pain Score 06/24/21 0826 5     Pain Loc --      Pain Edu? --      Excl. in [place]? --   No data found.  Updated Vital Signs BP (!) 140/86 (BP Location: Left Arm)   Pulse 85   Temp 98.6 F (37 C) (Oral)   Resp 18   SpO2 99%   Physical Exam Vitals and nursing note reviewed.  Constitutional:      General: She is not in acute distress.    Appearance: Normal appearance. She is not ill-appearing.  HENT:     Head: Normocephalic and atraumatic.     Salivary Glands: Right salivary gland is not diffusely enlarged or tender. Left salivary gland is not diffusely enlarged or tender.     Right Ear: Ear canal and external ear normal. No drainage. A middle ear effusion is present. There is no impacted cerumen. Tympanic membrane is bulging. Tympanic membrane is not injected or erythematous.     Left Ear: Ear canal and external ear normal. No drainage. A middle ear effusion is present. There is no impacted cerumen. Tympanic membrane is bulging. Tympanic membrane is not injected or erythematous.     Ears:     Comments: Bilateral EACs normal, both TMs bulging with clear fluid    Nose: Rhinorrhea present. No nasal deformity, septal deviation, signs of injury, nasal tenderness, mucosal edema or congestion. Rhinorrhea is clear.     Right Nostril: Occlusion present. No foreign body, epistaxis or septal hematoma.     Left Nostril: Occlusion present. No foreign body, epistaxis or septal hematoma.     Right Turbinates: Enlarged, swollen and pale.     Left Turbinates: Enlarged, swollen and pale.     Right Sinus: No maxillary sinus tenderness or frontal  sinus tenderness.     Left Sinus: No maxillary sinus tenderness or frontal sinus tenderness.     Mouth/Throat:     Lips: Pink. No lesions.     Mouth: Mucous membranes are moist. No oral lesions.     Pharynx: Oropharynx is clear. Uvula midline. No posterior oropharyngeal erythema or uvula swelling.     Tonsils: No tonsillar exudate. 0 on the right. 0 on the left.     Comments: Postnasal drip Eyes:     General: Lids  are normal.        Right eye: No discharge.        Left eye: No discharge.     Extraocular Movements: Extraocular movements intact.     Conjunctiva/sclera: Conjunctivae normal.     Right eye: Right conjunctiva is not injected.     Left eye: Left conjunctiva is not injected.  Neck:     Trachea: Trachea normal.     Comments: Hoarseness of voice Cardiovascular:     Rate and Rhythm: Normal rate and regular rhythm.     Pulses: Normal pulses.     Heart sounds: Normal heart sounds. No murmur heard.    No friction rub. No gallop.  Pulmonary:     Effort: Pulmonary effort is normal. No accessory muscle usage, prolonged expiration or respiratory distress.     Breath sounds: Normal breath sounds. No stridor, decreased air movement or transmitted upper airway sounds. No decreased breath sounds, wheezing, rhonchi or rales.  Chest:     Chest wall: No tenderness.  Musculoskeletal:        General: Normal range of motion.     Cervical back: Normal range of motion and neck supple. Normal range of motion.  Lymphadenopathy:     Cervical: No cervical adenopathy.  Skin:    General: Skin is warm and dry.     Findings: No erythema or rash.  Neurological:     General: No focal deficit present.     Mental Status: She is alert and oriented to person, place, and time.  Psychiatric:        Mood and Affect: Mood normal.        Behavior: Behavior normal.     Visual Acuity Right Eye Distance:   Left Eye Distance:   Bilateral Distance:    Right Eye Near:   Left Eye Near:    Bilateral  Near:     UC Couse / Diagnostics / Procedures:     Radiology No results found.  Procedures Procedures (including critical care time) EKG  Pending results:  Labs Reviewed - No data to display  Medications Ordered in UC: Medications - No data to display  UC Diagnoses / Final Clinical Impressions(s)   I have reviewed the triage vital signs and the nursing notes.  Pertinent labs & imaging results that were available during my care of the patient were reviewed by me and considered in my medical decision making (see chart for details).    Final diagnoses:  Allergic rhinitis, unspecified seasonality, unspecified trigger  Allergic rhinitis with postnasal drip   Patient advised to physical exam findings.  Patient provided with cetirizine and Flonase which she states she has been prescribed in the past.  Patient provided with an additional medication Atrovent to help reduce inflammation and rhinorrhea while onboarding Flonase.  Patient provided with 3 days of steroids to help resolve laryngitis.  ED Prescriptions     Medication Sig Dispense Auth. Provider   cetirizine (ZYRTEC ALLERGY) 10 MG tablet Take 1 tablet (10 mg total) by mouth at bedtime. 90 tablet Lynden Oxford Scales, PA-C   fluticasone (FLONASE) 50 MCG/ACT nasal spray Place 1 spray into both nostrils daily. Begin by using 2 sprays in each nare daily for 3 to 5 days, then decrease to 1 spray in each nare daily. 15.8 mL Lynden Oxford Scales, PA-C   ipratropium (ATROVENT) 0.06 % nasal spray Place 2 sprays into both nostrils 3 (three) times daily. As needed for nasal congestion, runny nose 15 mL  Lynden Oxford Scales, PA-C   methylPREDNISolone (MEDROL) 8 MG tablet Take 1 tablet (8 mg total) by mouth daily for 3 days. 3 tablet Lynden Oxford Scales, PA-C      PDMP not reviewed this encounter.  Disposition Upon Discharge:  Condition: stable for discharge home Home: take medications as prescribed; routine discharge  instructions as discussed; follow up as advised.  Patient presented with an acute illness with associated systemic symptoms and significant discomfort requiring urgent management. In my opinion, this is a condition that a prudent lay person (someone who possesses an average knowledge of health and medicine) may potentially expect to result in complications if not addressed urgently such as respiratory distress, impairment of bodily function or dysfunction of bodily organs.   Routine symptom specific, illness specific and/or disease specific instructions were discussed with the patient and/or caregiver at length.   As such, the patient has been evaluated and assessed, work-up was performed and treatment was provided in alignment with urgent care protocols and evidence based medicine.  Patient/parent/caregiver has been advised that the patient may require follow up for further testing and treatment if the symptoms continue in spite of treatment, as clinically indicated and appropriate.  If the patient was tested for COVID-19, Influenza and/or RSV, then the patient/parent/guardian was advised to isolate at home pending the results of his/her diagnostic coronavirus test and potentially longer if they're positive. I have also advised pt that if his/her COVID-19 test returns positive, it's recommended to self-isolate for at least 10 days after symptoms first appeared AND until fever-free for 24 hours without fever reducer AND other symptoms have improved or resolved. Discussed self-isolation recommendations as well as instructions for household member/close contacts as per the [facility] and [place] DHHS, and also gave patient the [place] packet with this information.  Patient/parent/caregiver has been advised to return to the [facility] or PCP in 3-5 days if no better; to PCP or the Emergency Department if new signs and symptoms develop, or if the current signs or symptoms continue to change or worsen for further workup, evaluation  and treatment as clinically indicated and appropriate  The patient will follow up with their current PCP if and as advised. If the patient does not currently have a PCP we will assist them in obtaining one.   The patient may need specialty follow up if the symptoms continue, in spite of conservative treatment and management, for further workup, evaluation, consultation and treatment as clinically indicated and appropriate.  Patient/parent/caregiver verbalized understanding and agreement of plan as discussed.  All questions were addressed during visit.  Please see discharge instructions below for further details of plan.  Discharge Instructions:   Discharge Instructions      Please see the list below for recommended medications, dosages and frequencies to provide relief of current symptoms:     Medrol Dosepak (methylprednisolone): This is a steroid that will significantly calm your upper and lower airways, please take the daily recommended quantity of tablets daily with your breakfast meal starting tomorrow morning until the prescription is complete.      Zyrtec (cetirizine): This is an excellent second-generation antihistamine that helps to reduce respiratory inflammatory response to environmental allergens.  In some patients, this medication can cause daytime sleepiness so I recommend that you take 1 tablet daily at bedtime.     Flonase (fluticasone): This is a steroid nasal spray that you use once daily, 1 spray in each nare.  This medication does not work well if you decide to use  it only used as you feel you need to, it works best used on a daily basis.  After 3 to 5 days of use, you will notice significant reduction of the inflammation and mucus production that is currently being caused by exposure to allergens, whether seasonal or environmental.  The most common side effect of this medication is nosebleeds.  If you experience a nosebleed, please discontinue use for 1 week, then feel free to  resume.  I have provided you with a prescription.     Atrovent (ipratropium): This is an excellent nasal decongestant spray I have added to your recommended nasal steroid that will not cause rebound congestion, please instill 2 sprays into each nare with each use.  Because nasal steroids can take several days before they begin to provide full benefit, I recommend that you use this spray in addition to the nasal steroid prescribed for you.  Please use it after you have used your nasal steroid and repeat up to 4 times daily as needed.  I have provided you with a prescription for this medication.      If you find that your health insurance will not pay for allergy medications, please consider downloading the GoodRx app and using to get a better price than the "off the shelf" price.     If you find that you have not had improvement of your symptoms in the next 5 to 7 days, please follow-up with your primary care provider or return here to urgent care for repeat evaluation and further recommendations.   Thank you for visiting urgent care today.  We appreciate the opportunity to participate in your care.            This office note has been dictated using Museum/gallery curator.  Unfortunately, this method of dictation can sometimes lead to typographical or grammatical errors.  I apologize for your inconvenience in advance if this occurs.  Please do not hesitate to reach out to me if clarification is needed.      Lynden Oxford Scales, PA-C 06/26/22 0730

## 2022-10-16 ENCOUNTER — Other Ambulatory Visit: Payer: Self-pay | Admitting: Family Medicine

## 2022-10-16 DIAGNOSIS — Z1231 Encounter for screening mammogram for malignant neoplasm of breast: Secondary | ICD-10-CM

## 2022-10-17 ENCOUNTER — Ambulatory Visit
Admission: RE | Admit: 2022-10-17 | Discharge: 2022-10-17 | Disposition: A | Discharge status: Home / Self Care | Payer: 59 | Source: Ambulatory Visit | Attending: Family Medicine | Admitting: Family Medicine

## 2022-10-17 DIAGNOSIS — Z1231 Encounter for screening mammogram for malignant neoplasm of breast: Secondary | ICD-10-CM

## 2022-10-20 ENCOUNTER — Other Ambulatory Visit: Payer: Self-pay | Admitting: Family Medicine

## 2022-10-20 DIAGNOSIS — R928 Other abnormal and inconclusive findings on diagnostic imaging of breast: Secondary | ICD-10-CM

## 2022-10-31 ENCOUNTER — Ambulatory Visit
Admission: RE | Admit: 2022-10-31 | Discharge: 2022-10-31 | Disposition: A | Discharge status: Home / Self Care | Payer: 59 | Source: Ambulatory Visit | Attending: Family Medicine | Admitting: Family Medicine

## 2022-10-31 DIAGNOSIS — N6311 Unspecified lump in the right breast, upper outer quadrant: Secondary | ICD-10-CM | POA: Diagnosis not present

## 2022-10-31 DIAGNOSIS — R928 Other abnormal and inconclusive findings on diagnostic imaging of breast: Secondary | ICD-10-CM

## 2022-11-13 ENCOUNTER — Other Ambulatory Visit: Payer: Self-pay | Admitting: Family Medicine

## 2022-11-13 DIAGNOSIS — N6001 Solitary cyst of right breast: Secondary | ICD-10-CM

## 2022-12-25 DIAGNOSIS — J309 Allergic rhinitis, unspecified: Secondary | ICD-10-CM | POA: Diagnosis not present

## 2022-12-25 DIAGNOSIS — E782 Mixed hyperlipidemia: Secondary | ICD-10-CM | POA: Diagnosis not present

## 2022-12-25 DIAGNOSIS — I10 Essential (primary) hypertension: Secondary | ICD-10-CM | POA: Diagnosis not present

## 2022-12-25 DIAGNOSIS — Z0000 Encounter for general adult medical examination without abnormal findings: Secondary | ICD-10-CM | POA: Diagnosis not present

## 2022-12-25 DIAGNOSIS — Z1211 Encounter for screening for malignant neoplasm of colon: Secondary | ICD-10-CM | POA: Diagnosis not present

## 2023-01-10 DIAGNOSIS — H524 Presbyopia: Secondary | ICD-10-CM | POA: Diagnosis not present

## 2023-01-10 DIAGNOSIS — H35411 Lattice degeneration of retina, right eye: Secondary | ICD-10-CM | POA: Diagnosis not present

## 2023-01-10 DIAGNOSIS — H5213 Myopia, bilateral: Secondary | ICD-10-CM | POA: Diagnosis not present

## 2023-05-07 ENCOUNTER — Ambulatory Visit
Admission: RE | Admit: 2023-05-07 | Discharge: 2023-05-07 | Disposition: A | Discharge status: Home / Self Care | Payer: 59 | Source: Ambulatory Visit | Attending: Family Medicine | Admitting: Family Medicine

## 2023-05-07 DIAGNOSIS — N6311 Unspecified lump in the right breast, upper outer quadrant: Secondary | ICD-10-CM | POA: Diagnosis not present

## 2023-05-07 DIAGNOSIS — N6001 Solitary cyst of right breast: Secondary | ICD-10-CM

## 2023-05-21 DIAGNOSIS — Z01419 Encounter for gynecological examination (general) (routine) without abnormal findings: Secondary | ICD-10-CM | POA: Diagnosis not present

## 2023-05-21 DIAGNOSIS — A609 Anogenital herpesviral infection, unspecified: Secondary | ICD-10-CM | POA: Diagnosis not present

## 2023-05-21 DIAGNOSIS — Z3041 Encounter for surveillance of contraceptive pills: Secondary | ICD-10-CM | POA: Diagnosis not present

## 2023-06-26 DIAGNOSIS — E782 Mixed hyperlipidemia: Secondary | ICD-10-CM | POA: Diagnosis not present

## 2023-09-13 ENCOUNTER — Other Ambulatory Visit: Payer: Self-pay | Admitting: Family Medicine

## 2023-09-13 DIAGNOSIS — Z1231 Encounter for screening mammogram for malignant neoplasm of breast: Secondary | ICD-10-CM

## 2023-10-05 LAB — POCT ABI - SCREENING FOR PILOT NO CHARGE
Left ABI: 1.04
Right ABI: 0.99

## 2023-10-05 LAB — HEMOGLOBIN A1C: Hemoglobin A1C: 5.5

## 2023-10-05 NOTE — Progress Notes (Signed)
 Pt has a pcp. Pt does not smoke. Pt was given HTN education, healthy diet.

## 2023-11-01 ENCOUNTER — Ambulatory Visit
Admission: RE | Admit: 2023-11-01 | Discharge: 2023-11-01 | Disposition: A | Discharge status: Home / Self Care | Payer: 59 | Source: Ambulatory Visit | Attending: Family Medicine | Admitting: Family Medicine

## 2023-11-01 DIAGNOSIS — Z1231 Encounter for screening mammogram for malignant neoplasm of breast: Secondary | ICD-10-CM | POA: Diagnosis not present

## 2023-11-21 NOTE — Progress Notes (Signed)
 The patient attended a screening event on 10/05/2023 where her screening results were 139/88 after second retest, 5.5% A1c. At the event the patient noted Rachel Mcconnell as her PCP and that she is not a smoker. Health insurance status was not indicated at the event. SDOHs were not indicated at the event.   Per chart review Dr. Tally Mcconnell - [name] at [facility] is her PCP, she was last seen on 06/26/23 where her bp was addressed during the visit. Chart review does not show any upcoming [facility] visible appts with her PCP.Chart review further shows indicates that pt has [employer] and [employer] as her health insurance.   CHW called pt to ask if she has shared her screening results with her PCP. CHW left vm for pt to return call to discuss the results.   No additional Health equity team support indicated at this time./An additional follow up will be done in according to the health equity team's protocol.

## 2024-01-29 DIAGNOSIS — E782 Mixed hyperlipidemia: Secondary | ICD-10-CM | POA: Diagnosis not present

## 2024-05-05 DIAGNOSIS — H43393 Other vitreous opacities, bilateral: Secondary | ICD-10-CM | POA: Diagnosis not present

## 2024-05-05 DIAGNOSIS — H33012 Retinal detachment with single break, left eye: Secondary | ICD-10-CM | POA: Diagnosis not present

## 2024-05-05 DIAGNOSIS — H35413 Lattice degeneration of retina, bilateral: Secondary | ICD-10-CM | POA: Diagnosis not present

## 2024-05-05 DIAGNOSIS — H2513 Age-related nuclear cataract, bilateral: Secondary | ICD-10-CM | POA: Diagnosis not present

## 2024-05-12 DIAGNOSIS — H33012 Retinal detachment with single break, left eye: Secondary | ICD-10-CM | POA: Diagnosis not present

## 2024-05-22 DIAGNOSIS — H33321 Round hole, right eye: Secondary | ICD-10-CM | POA: Diagnosis not present

## 2024-06-09 DIAGNOSIS — Z124 Encounter for screening for malignant neoplasm of cervix: Secondary | ICD-10-CM | POA: Diagnosis not present

## 2024-06-09 DIAGNOSIS — N926 Irregular menstruation, unspecified: Secondary | ICD-10-CM | POA: Diagnosis not present

## 2024-06-09 DIAGNOSIS — A609 Anogenital herpesviral infection, unspecified: Secondary | ICD-10-CM | POA: Diagnosis not present

## 2024-06-09 DIAGNOSIS — Z01419 Encounter for gynecological examination (general) (routine) without abnormal findings: Secondary | ICD-10-CM | POA: Diagnosis not present

## 2024-07-07 DIAGNOSIS — N926 Irregular menstruation, unspecified: Secondary | ICD-10-CM | POA: Diagnosis not present

## 2024-07-28 DIAGNOSIS — Z23 Encounter for immunization: Secondary | ICD-10-CM | POA: Diagnosis not present

## 2024-07-28 DIAGNOSIS — J309 Allergic rhinitis, unspecified: Secondary | ICD-10-CM | POA: Diagnosis not present

## 2024-07-28 DIAGNOSIS — E782 Mixed hyperlipidemia: Secondary | ICD-10-CM | POA: Diagnosis not present

## 2024-07-28 DIAGNOSIS — I10 Essential (primary) hypertension: Secondary | ICD-10-CM | POA: Diagnosis not present

## 2024-10-03 ENCOUNTER — Encounter: Payer: Self-pay | Admitting: *Deleted

## 2024-10-03 LAB — GLUCOSE, POCT (MANUAL RESULT ENTRY): Glucose Fasting, POC: 94 mg/dL (ref 70–99)

## 2024-10-03 NOTE — Progress Notes (Incomplete)
 Pt attended 10/03/24 screening event where her BP was 112/77 and blood glucose was 94. Pt documented that she does have a PCP and listed private as her insurance. Pt noted that she is not an active smoker and has no SDOH needs.
